# Patient Record
Sex: Male | Born: 1990
Health system: Southern US, Community
[De-identification: ages and names within clinical notes are randomized; demographics above are authoritative.]

## PROBLEM LIST (undated history)

## (undated) DIAGNOSIS — G43909 Migraine, unspecified, not intractable, without status migrainosus: Secondary | ICD-10-CM

## (undated) DIAGNOSIS — A749 Chlamydial infection, unspecified: Secondary | ICD-10-CM

## (undated) HISTORY — PX: TONSILLECTOMY: SUR1361

---

## 2015-11-21 ENCOUNTER — Encounter (HOSPITAL_COMMUNITY): Payer: Self-pay

## 2015-11-21 ENCOUNTER — Emergency Department (HOSPITAL_COMMUNITY)
Admission: EM | Admit: 2015-11-21 | Discharge: 2015-11-21 | Disposition: A | Discharge status: Home / Self Care | Payer: 59 | Attending: Emergency Medicine | Admitting: Emergency Medicine

## 2015-11-21 DIAGNOSIS — F17200 Nicotine dependence, unspecified, uncomplicated: Secondary | ICD-10-CM | POA: Diagnosis not present

## 2015-11-21 DIAGNOSIS — K226 Gastro-esophageal laceration-hemorrhage syndrome: Secondary | ICD-10-CM | POA: Diagnosis not present

## 2015-11-21 DIAGNOSIS — R112 Nausea with vomiting, unspecified: Secondary | ICD-10-CM

## 2015-11-21 DIAGNOSIS — K920 Hematemesis: Secondary | ICD-10-CM | POA: Insufficient documentation

## 2015-11-21 LAB — CBC
HCT: 49.2 % (ref 39.0–52.0)
Hemoglobin: 16.8 g/dL (ref 13.0–17.0)
MCH: 33.1 pg (ref 26.0–34.0)
MCHC: 34.1 g/dL (ref 30.0–36.0)
MCV: 96.9 fL (ref 78.0–100.0)
PLATELETS: 275 10*3/uL (ref 150–400)
RBC: 5.08 MIL/uL (ref 4.22–5.81)
RDW: 12.8 % (ref 11.5–15.5)
WBC: 6.5 10*3/uL (ref 4.0–10.5)

## 2015-11-21 LAB — POC OCCULT BLOOD, ED: Fecal Occult Bld: NEGATIVE

## 2015-11-21 LAB — ABO/RH: ABO/RH(D): A POS

## 2015-11-21 LAB — TYPE AND SCREEN
ABO/RH(D): A POS
ANTIBODY SCREEN: NEGATIVE

## 2015-11-21 LAB — COMPREHENSIVE METABOLIC PANEL
ALBUMIN: 4.5 g/dL (ref 3.5–5.0)
ALK PHOS: 47 U/L (ref 38–126)
ALT: 32 U/L (ref 17–63)
AST: 37 U/L (ref 15–41)
Anion gap: 11 (ref 5–15)
BILIRUBIN TOTAL: 0.9 mg/dL (ref 0.3–1.2)
BUN: 10 mg/dL (ref 6–20)
CALCIUM: 9.3 mg/dL (ref 8.9–10.3)
CO2: 24 mmol/L (ref 22–32)
CREATININE: 1.02 mg/dL (ref 0.61–1.24)
Chloride: 102 mmol/L (ref 101–111)
GFR calc Af Amer: >60 mL/min (ref >60)
GLUCOSE: 109 mg/dL — AB (ref 65–99)
Potassium: 4 mmol/L (ref 3.5–5.1)
Sodium: 137 mmol/L (ref 135–145)
TOTAL PROTEIN: 7.9 g/dL (ref 6.5–8.1)

## 2015-11-21 MED ORDER — OMEPRAZOLE 20 MG PO CPDR: 20.0000 mg | DELAYED_RELEASE_CAPSULE | Freq: Every day | ORAL | Status: DC

## 2015-11-21 MED ORDER — SODIUM CHLORIDE 0.9 % IV BOLUS (SEPSIS)
1000.0000 mL | Freq: Once | INTRAVENOUS | Status: AC
Start: 2015-11-21 — End: 2015-11-21
  Administered 2015-11-21: 1000 mL via INTRAVENOUS

## 2015-11-21 MED ORDER — ONDANSETRON HCL 4 MG PO TABS: 4.0000 mg | ORAL_TABLET | Freq: Four times a day (QID) | ORAL | Status: DC

## 2015-11-21 MED ORDER — ONDANSETRON 4 MG PO TBDP
4.0000 mg | ORAL_TABLET | Freq: Once | ORAL | Status: AC
  Administered 2015-11-21: 4 mg via ORAL

## 2015-11-21 MED ORDER — ONDANSETRON HCL 4 MG/2ML IJ SOLN
4.0000 mg | Freq: Once | INTRAMUSCULAR | Status: AC
  Administered 2015-11-21: 4 mg via INTRAVENOUS
  Filled 2015-11-21: qty 2

## 2015-11-21 MED ORDER — ONDANSETRON 4 MG PO TBDP
ORAL_TABLET | ORAL | Status: AC
  Filled 2015-11-21: qty 1

## 2015-11-21 NOTE — ED Notes (Signed)
Pt states vomiting blood. Pt nauseated, lightheaded and stomach pain. Pt complaining of dark tarry stools x 2 days.

## 2015-11-21 NOTE — Discharge Instructions (Signed)
Gastrointestinal Bleeding Gastrointestinal (GI) bleeding means there is bleeding somewhere along the digestive tract, between the mouth and anus. CAUSES  There are many different problems that can cause GI bleeding. Possible causes include:  Esophagitis. This is inflammation, irritation, or swelling of the esophagus.  Hemorrhoids.These are veins that are full of blood (engorged) in the rectum. They cause pain, inflammation, and may bleed.  Anal fissures.These are areas of painful tearing which may bleed. They are often caused by passing hard stool.  Diverticulosis.These are pouches that form on the colon over time, with age, and may bleed significantly.  Diverticulitis.This is inflammation in areas with diverticulosis. It can cause pain, fever, and bloody stools, although bleeding is rare.  Polyps and cancer. Colon cancer often starts out as precancerous polyps.  Gastritis and ulcers.Bleeding from the upper gastrointestinal tract (near the stomach) may travel through the intestines and produce black, sometimes tarry, often bad smelling stools. In certain cases, if the bleeding is fast enough, the stools may not be black, but red. This condition may be life-threatening. SYMPTOMS   Vomiting bright red blood or material that looks like coffee grounds.  Bloody, black, or tarry stools. DIAGNOSIS  Your caregiver may diagnose your condition by taking your history and performing a physical exam. More tests may be needed, including:  X-rays and other imaging tests.  Esophagogastroduodenoscopy (EGD). This test uses a flexible, lighted tube to look at your esophagus, stomach, and small intestine.  Colonoscopy. This test uses a flexible, lighted tube to look at your colon. TREATMENT  Treatment depends on the cause of your bleeding.   For bleeding from the esophagus, stomach, small intestine, or colon, the caregiver doing your EGD or colonoscopy may be able to stop the bleeding as part of  the procedure.  Inflammation or infection of the colon can be treated with medicines.  Many rectal problems can be treated with creams, suppositories, or warm baths.  Surgery is sometimes needed.  Blood transfusions are sometimes needed if you have lost a lot of blood. If bleeding is slow, you may be allowed to go home. If there is a lot of bleeding, you will need to stay in the hospital for observation. HOME CARE INSTRUCTIONS   Take any medicines exactly as prescribed.  Keep your stools soft by eating foods that are high in fiber. These foods include whole grains, legumes, fruits, and vegetables. Prunes (1 to 3 a day) work well for many people.  Drink enough fluids to keep your urine clear or pale yellow. SEEK IMMEDIATE MEDICAL CARE IF:   Your bleeding increases.  You feel lightheaded, weak, or you faint.  You have severe cramps in your back or abdomen.  You pass large blood clots in your stool.  Your problems are getting worse. MAKE SURE YOU:   Understand these instructions.  Will watch your condition.  Will get help right away if you are not doing well or get worse.   This information is not intended to replace advice given to you by your health care provider. Make sure you discuss any questions you have with your health care provider.   Document Released: 05/28/2000 Document Revised: 05/17/2012 Document Reviewed: 11/18/2014 Elsevier Interactive Patient Education 2016 Elsevier Inc[employer] Hematemesis Hematemesis is when you vomit blood. It is a sign of bleeding in the upper part of your digestive tract. This is also called your gastrointestinal (GI) tract. Your upper GI tract includes your mouth, throat, esophagus, stomach, and the first part of your small intestine (  duodenum).  Hematemesis is usually caused by bleeding from your esophagus or stomach. You may suddenly vomit bright red blood. You might also vomit old blood. It may look like coffee grounds. You may also have  other symptoms, such as:  Stomach pain.  Heartburn.  Black and tarry stool.  HOME CARE INSTRUCTIONS  Watch your hematemesis for any changes. The following actions may help to lessen any discomfort you are feeling:  Take medicines only as directed by your health care provider. Do not take aspirin, ibuprofen, or any other anti-inflammatory medicine without approval from your health care provider.  Rest as needed.  Drink small sips of clear liquids often, as long as you can keep them down. Try to drink enough fluids to keep your urine clear or pale yellow.  Do not drink alcohol.  Do not use any tobacco products, including cigarettes, chewing tobacco, or electronic cigarettes. If you need help quitting, ask your health care provider.  Keep all follow-up visits as directed by your health care provider. This is important. SEEK MEDICAL CARE IF:   The vomiting of blood worsens, or begins again after it has stopped.  You have persistent stomach pain.  You have nausea, indigestion, or heartburn.  You feel weak or dizzy. SEEK IMMEDIATE MEDICAL CARE IF:   You faint or feel extremely weak.  You have a rapid heartbeat.  You are urinating less than normal or not at all.  You have persistent vomiting.  You vomit large amounts of bloody or dark material.  You vomit bright red blood.  You pass large, dark, or bloody stools.  You have chest pain or trouble breathing.   This information is not intended to replace advice given to you by your health care provider. Make sure you discuss any questions you have with your health care provider.   Document Released: 07/08/2004 Document Revised: 06/21/2014 Document Reviewed: 01/23/2014 Elsevier Interactive Patient Education 2016 Elsevier Inc[employer] Nausea and Vomiting Nausea is a sick feeling that often comes before throwing up (vomiting). Vomiting is a reflex where stomach contents come out of your mouth. Vomiting can cause severe loss of body  fluids (dehydration). Children and elderly adults can become dehydrated quickly, especially if they also have diarrhea. Nausea and vomiting are symptoms of a condition or disease. It is important to find the cause of your symptoms. CAUSES   Direct irritation of the stomach lining. This irritation can result from increased acid production (gastroesophageal reflux disease), infection, food poisoning, taking certain medicines (such as nonsteroidal anti-inflammatory drugs), alcohol use, or tobacco use.  Signals from the brain.These signals could be caused by a headache, heat exposure, an inner ear disturbance, increased pressure in the brain from injury, infection, a tumor, or a concussion, pain, emotional stimulus, or metabolic problems.  An obstruction in the gastrointestinal tract (bowel obstruction).  Illnesses such as diabetes, hepatitis, gallbladder problems, appendicitis, kidney problems, cancer, sepsis, atypical symptoms of a heart attack, or eating disorders.  Medical treatments such as chemotherapy and radiation.  Receiving medicine that makes you sleep (general anesthetic) during surgery. DIAGNOSIS Your caregiver may ask for tests to be done if the problems do not improve after a few days. Tests may also be done if symptoms are severe or if the reason for the nausea and vomiting is not clear. Tests may include:  Urine tests.  Blood tests.  Stool tests.  Cultures (to look for evidence of infection).  X-rays or other imaging studies. Test results can help your caregiver make  decisions about treatment or the need for additional tests. TREATMENT You need to stay well hydrated. Drink frequently but in small amounts.You may wish to drink water, sports drinks, clear broth, or eat frozen ice pops or gelatin dessert to help stay hydrated.When you eat, eating slowly may help prevent nausea.There are also some antinausea medicines that may help prevent nausea. HOME CARE INSTRUCTIONS    Take all medicine as directed by your caregiver.  If you do not have an appetite, do not force yourself to eat. However, you must continue to drink fluids.  If you have an appetite, eat a normal diet unless your caregiver tells you differently.  Eat a variety of complex carbohydrates (rice, wheat, potatoes, bread), lean meats, yogurt, fruits, and vegetables.  Avoid high-fat foods because they are more difficult to digest.  Drink enough water and fluids to keep your urine clear or pale yellow.  If you are dehydrated, ask your caregiver for specific rehydration instructions. Signs of dehydration may include:  Severe thirst.  Dry lips and mouth.  Dizziness.  Dark urine.  Decreasing urine frequency and amount.  Confusion.  Rapid breathing or pulse. SEEK IMMEDIATE MEDICAL CARE IF:   You have blood or brown flecks (like coffee grounds) in your vomit.  You have black or bloody stools.  You have a severe headache or stiff neck.  You are confused.  You have severe abdominal pain.  You have chest pain or trouble breathing.  You do not urinate at least once every 8 hours.  You develop cold or clammy skin.  You continue to vomit for longer than 24 to 48 hours.  You have a fever. MAKE SURE YOU:   Understand these instructions.  Will watch your condition.  Will get help right away if you are not doing well or get worse.   This information is not intended to replace advice given to you by your health care provider. Make sure you discuss any questions you have with your health care provider.   Document Released: 05/31/2005 Document Revised: 08/23/2011 Document Reviewed: 10/28/2010 Elsevier Interactive Patient Education [employer]

## 2015-11-21 NOTE — ED Provider Notes (Signed)
CSN: [id number]     Arrival date & time 11/21/15  1806 History   First MD Initiated Contact with Patient 11/21/15 2132     Chief Complaint  Patient presents with  . GI Bleeding     (Consider location/radiation/quality/duration/timing/severity/associated sxs/prior Treatment) HPI Comments: Patient presents to the emergency department with chief complaint of hematemesis. He states that he was eating lunch today and having a few beers, when he began to feel nauseated. States that he vomited food, then vomited a small amount of blood, then a significant amount of blood. He had one other episode of hematemesis, and states that the blood was darker and a smaller amount. He states that he is feeling improved now. He has taken Zofran given to him in the waiting room. He denies any prior medical problems. He states that he drinks heavily. There are no modifying factors.  The history is provided by the patient. No language interpreter was used.    History reviewed. No pertinent past medical history. History reviewed. No pertinent past surgical history. History reviewed. No pertinent family history. Social History  Substance Use Topics  . Smoking status: Current Every Day Smoker  . Smokeless tobacco: None  . Alcohol Use: Yes    Review of Systems  Constitutional: Negative for fever and chills.  Respiratory: Negative for shortness of breath.   Cardiovascular: Negative for chest pain.  Gastrointestinal: Positive for vomiting. Negative for nausea, diarrhea and constipation.  Genitourinary: Negative for dysuria.  All other systems reviewed and are negative.     Allergies  Review of patient's allergies indicates no known allergies.  Home Medications   Prior to Admission medications   Not on File   BP 117/55 mmHg  Pulse 98  Temp(Src) 98.7 F (37.1 C) (Oral)  Resp 17  SpO2 100% Physical Exam  Constitutional: He is oriented to person, place, and time. He appears well-developed and  well-nourished.  HENT:  Head: Normocephalic and atraumatic.  Eyes: Conjunctivae and EOM are normal. Pupils are equal, round, and reactive to light. Right eye exhibits no discharge. Left eye exhibits no discharge. No scleral icterus.  Neck: Normal range of motion. Neck supple. No JVD present.  Cardiovascular: Normal rate, regular rhythm and normal heart sounds.  Exam reveals no gallop and no friction rub.   No murmur heard. Pulmonary/Chest: Effort normal and breath sounds normal. No respiratory distress. He has no wheezes. He has no rales. He exhibits no tenderness.  Lungs are clear to auscultation  Abdominal: Soft. He exhibits no distension and no mass. There is no tenderness. There is no rebound and no guarding.  No focal abdominal tenderness, no RLQ tenderness or pain at McBurney's point, no RUQ tenderness or Murphy's sign, no left-sided abdominal tenderness, no fluid wave, or signs of peritonitis   Musculoskeletal: Normal range of motion. He exhibits no edema or tenderness.  Neurological: He is alert and oriented to person, place, and time.  Skin: Skin is warm and dry.  Psychiatric: He has a normal mood and affect. His behavior is normal. Judgment and thought content normal.  Nursing note and vitals reviewed.   ED Course  Procedures (including critical care time) Results for orders placed or performed during the hospital encounter of 11/21/15  Comprehensive metabolic panel  Result Value Ref Range   Sodium 137 135 - 145 mmol/L   Potassium 4.0 3.5 - 5.1 mmol/L   Chloride 102 101 - 111 mmol/L   CO2 24 22 - 32 mmol/L   Glucose, Bld  109 (H) 65 - 99 mg/dL   BUN 10 6 - 20 mg/dL   Creatinine, Ser 1.611.02 0.61 - 1.24 mg/dL   Calcium 9.3 8.9 - 09.610.3 mg/dL   Total Protein 7.9 6.5 - 8.1 g/dL   Albumin 4.5 3.5 - 5.0 g/dL   AST 37 15 - 41 U/L   ALT 32 17 - 63 U/L   Alkaline Phosphatase 47 38 - 126 U/L   Total Bilirubin 0.9 0.3 - 1.2 mg/dL   GFR calc non Af Amer >60 >60 mL/min   GFR calc Af  Amer >60 >60 mL/min   Anion gap 11 5 - 15  CBC  Result Value Ref Range   WBC 6.5 4.0 - 10.5 K/uL   RBC 5.08 4.22 - 5.81 MIL/uL   Hemoglobin 16.8 13.0 - 17.0 g/dL   HCT 04.549.2 40.939.0 - 81.152.0 %   MCV 96.9 78.0 - 100.0 fL   MCH 33.1 26.0 - 34.0 pg   MCHC 34.1 30.0 - 36.0 g/dL   RDW 91.412.8 78.211.5 - 95.615.5 %   Platelets 275 150 - 400 K/uL  POC occult blood, ED  Result Value Ref Range   Fecal Occult Bld NEGATIVE NEGATIVE  Type and screen [facility]  Result Value Ref Range   ABO/RH(D) A POS    Antibody Screen NEG    Sample Expiration 11/24/2015   ABO/Rh  Result Value Ref Range   ABO/RH(D) A POS    No results found.  I have personally reviewed and evaluated these images and lab results as part of my medical decision-making.   EKG Interpretation None      MDM   Final diagnoses:  Non-intractable vomiting with nausea, vomiting of unspecified type  Mallory-Weiss tear    Patient with reported hematemesis. Vomiting started after lunch. First vomited stomach contents, then small amount of blood, followed by large blood. Had one additional episode of bloody vomit, but this has since resolved. Laboratory workup is reassuring.  Stable H&H, vital signs are stable.  10:11 PM Patient discussed with Dr. Effie ShyWentz, who states that it sounds like a mallory-weiss tear.  I agree. No focal abdominal pain.  Will give some fluids and PO challenge.  Patient still has not had any vomiting. Is feeling well. Requests discharge. Patient given return precautions. He understands and agrees with plan.  Roxy Horsemanobert Odessie Polzin, PA-C 11/22/15 0006  Mancel BaleElliott Wentz, MD 11/22/15 [phone]

## 2015-12-27 ENCOUNTER — Encounter (HOSPITAL_COMMUNITY): Payer: Self-pay | Admitting: Emergency Medicine

## 2015-12-27 ENCOUNTER — Ambulatory Visit (HOSPITAL_COMMUNITY)
Admission: EM | Admit: 2015-12-27 | Discharge: 2015-12-27 | Disposition: A | Discharge status: Home / Self Care | Payer: 59 | Attending: Emergency Medicine | Admitting: Emergency Medicine

## 2015-12-27 DIAGNOSIS — R109 Unspecified abdominal pain: Secondary | ICD-10-CM | POA: Insufficient documentation

## 2015-12-27 DIAGNOSIS — R509 Fever, unspecified: Secondary | ICD-10-CM | POA: Diagnosis not present

## 2015-12-27 DIAGNOSIS — F17200 Nicotine dependence, unspecified, uncomplicated: Secondary | ICD-10-CM | POA: Insufficient documentation

## 2015-12-27 DIAGNOSIS — Z79899 Other long term (current) drug therapy: Secondary | ICD-10-CM | POA: Insufficient documentation

## 2015-12-27 DIAGNOSIS — R369 Urethral discharge, unspecified: Secondary | ICD-10-CM | POA: Diagnosis not present

## 2015-12-27 HISTORY — DX: Migraine, unspecified, not intractable, without status migrainosus: G43.909

## 2015-12-27 LAB — POCT URINALYSIS DIP (DEVICE)
Bilirubin Urine: NEGATIVE
GLUCOSE, UA: NEGATIVE mg/dL
Hgb urine dipstick: NEGATIVE
KETONES UR: NEGATIVE mg/dL
Nitrite: NEGATIVE
PH: 6 (ref 5.0–8.0)
PROTEIN: NEGATIVE mg/dL
UROBILINOGEN UA: 0.2 mg/dL (ref 0.0–1.0)

## 2015-12-27 LAB — COMPREHENSIVE METABOLIC PANEL
ALK PHOS: 64 U/L (ref 38–126)
ALT: 36 U/L (ref 17–63)
ANION GAP: 9 (ref 5–15)
AST: 39 U/L (ref 15–41)
Albumin: 3.9 g/dL (ref 3.5–5.0)
BILIRUBIN TOTAL: 0.5 mg/dL (ref 0.3–1.2)
BUN: 7 mg/dL (ref 6–20)
CALCIUM: 9.4 mg/dL (ref 8.9–10.3)
CO2: 25 mmol/L (ref 22–32)
Chloride: 101 mmol/L (ref 101–111)
Creatinine, Ser: 0.89 mg/dL (ref 0.61–1.24)
Glucose, Bld: 124 mg/dL — ABNORMAL HIGH (ref 65–99)
POTASSIUM: 3.4 mmol/L — AB (ref 3.5–5.1)
Sodium: 135 mmol/L (ref 135–145)
TOTAL PROTEIN: 7.5 g/dL (ref 6.5–8.1)

## 2015-12-27 MED ORDER — LIDOCAINE HCL (PF) 1 % IJ SOLN
INTRAMUSCULAR | Status: AC
  Filled 2015-12-27: qty 2

## 2015-12-27 MED ORDER — CEFTRIAXONE SODIUM 250 MG IJ SOLR
250.0000 mg | Freq: Once | INTRAMUSCULAR | Status: AC
  Administered 2015-12-27: 250 mg via INTRAMUSCULAR

## 2015-12-27 MED ORDER — AZITHROMYCIN 250 MG PO TABS
ORAL_TABLET | ORAL | Status: AC
Start: 2015-12-27 — End: 2015-12-27
  Filled 2015-12-27: qty 4

## 2015-12-27 MED ORDER — IBUPROFEN 800 MG PO TABS
ORAL_TABLET | ORAL | Status: AC
  Filled 2015-12-27: qty 1

## 2015-12-27 MED ORDER — CIPROFLOXACIN HCL 500 MG PO TABS: 500.0000 mg | ORAL_TABLET | Freq: Two times a day (BID) | ORAL | Status: DC

## 2015-12-27 MED ORDER — AZITHROMYCIN 250 MG PO TABS
1000.0000 mg | ORAL_TABLET | Freq: Once | ORAL | Status: AC
  Administered 2015-12-27: 1000 mg via ORAL

## 2015-12-27 MED ORDER — CEFTRIAXONE SODIUM 250 MG IJ SOLR
INTRAMUSCULAR | Status: AC
  Filled 2015-12-27: qty 250

## 2015-12-27 MED ORDER — IBUPROFEN 800 MG PO TABS
400.0000 mg | ORAL_TABLET | Freq: Once | ORAL | Status: AC
  Administered 2015-12-27: 400 mg via ORAL

## 2015-12-27 NOTE — ED Notes (Addendum)
Feeling really tired , thirsty, increase in frequency.  Pain in lower back.  Symptoms started this morning.  Reports intermittent shooting pain in testicular

## 2015-12-27 NOTE — Discharge Instructions (Signed)
It was nice seeing you today. I am sorry about your symptoms. It could be that you have urine infection. We will send your urine to lab for culture. While awaiting report please start oral antibiotic. We also treated you for gonorrhea and chlamydia after obtaining your urine for testing. We will contact you with result if abnormal. We did HIV and syphilis testing as well. If symptoms worsens please go to the ED.

## 2015-12-27 NOTE — ED Provider Notes (Signed)
CSN: [id number]     Arrival date & time 12/27/15  1851 History   None    No chief complaint on file.  (Consider location/radiation/quality/duration/timing/severity/associated sxs/prior Treatment) Patient is a 25 y.o. male presenting with back pain and penile discharge. The history is provided by the patient. No language interpreter was used.  Back Pain Location:  Lumbar spine (flank pain B/L) Quality:  Aching Radiates to:  Does not radiate Pain severity:  Moderate (pain is about 10/10 in severity) Onset quality:  Sudden Duration:  2 days Timing:  Constant Progression:  Worsening Chronicity:  New Context comment:  NO INJURY Relieved by:  Nothing Worsened by:  Nothing tried Ineffective treatments:  None tried Associated symptoms: fever   Associated symptoms: no abdominal pain, no abdominal swelling, no bladder incontinence, no bowel incontinence, no chest pain, no dysuria, no numbness, no paresthesias and no weight loss   Associated symptoms comment:  Brief scrotal pain for short period few hours ago, now it is all better. Risk factors comment:  Recently had sex with his girl friend and she told him she was positive for chlamydia. Endorsed penile discharge. Penile Discharge This is a new problem. The current episode started 2 days ago. The problem occurs constantly. The problem has not changed since onset.Pertinent negatives include no chest pain and no abdominal pain. Associated symptoms comments: Recently had unprotected sex with girl friend who had chlamydia. Nothing aggravates the symptoms. Nothing relieves the symptoms. He has tried nothing for the symptoms.    No past medical history on file. No past surgical history on file. No family history on file. Social History  Substance Use Topics  . Smoking status: Current Every Day Smoker  . Smokeless tobacco: Not on file  . Alcohol Use: Yes    Review of Systems  Constitutional: Positive for fever. Negative for weight loss.   Respiratory: Negative.   Cardiovascular: Negative.  Negative for chest pain.  Gastrointestinal: Negative for abdominal pain and bowel incontinence.  Genitourinary: Positive for discharge. Negative for bladder incontinence and dysuria.  Musculoskeletal: Positive for back pain.  Neurological: Negative for numbness and paresthesias.  All other systems reviewed and are negative.   Allergies  Review of patient's allergies indicates no known allergies.  Home Medications   Prior to Admission medications   Medication Sig Start Date End Date Taking? Authorizing Provider  omeprazole (PRILOSEC) 20 MG capsule Take 1 capsule (20 mg total) by mouth daily. 11/21/15   Roxy Horseman, PA-C  ondansetron (ZOFRAN) 4 MG tablet Take 1 tablet (4 mg total) by mouth every 6 (six) hours. 11/21/15   Roxy Horseman, PA-C   Meds Ordered and Administered this Visit  Medications - No data to display  There were no vitals taken for this visit. No data found.   Physical Exam  Constitutional: He is oriented to person, place, and time. He appears well-developed. No distress.  Cardiovascular: Normal rate, regular rhythm and normal heart sounds.   No murmur heard. Pulmonary/Chest: Effort normal and breath sounds normal. No respiratory distress. He has no wheezes.  Abdominal: Soft. Bowel sounds are normal. He exhibits no distension and no mass. There is no tenderness.  Musculoskeletal: Normal range of motion. He exhibits no edema.  Neurological: He is alert and oriented to person, place, and time.  Nursing note and vitals reviewed.   ED Course  Procedures (including critical care time)  Labs Review Labs Reviewed - No data to display  Imaging Review No results found.   Visual Acuity  Review  Right Eye Distance:   Left Eye Distance:   Bilateral Distance:    Right Eye Near:   Left Eye Near:    Bilateral Near:      Urinalysis    Component Value Date/Time   LABSPEC <=1.005 12/27/2015 2055   PHURINE  6.0 12/27/2015 2055   GLUCOSEU NEGATIVE 12/27/2015 2055   HGBUR NEGATIVE 12/27/2015 2055   BILIRUBINUR NEGATIVE 12/27/2015 2055   KETONESUR NEGATIVE 12/27/2015 2055   PROTEINUR NEGATIVE 12/27/2015 2055   UROBILINOGEN 0.2 12/27/2015 2055   NITRITE NEGATIVE 12/27/2015 2055   LEUKOCYTESUR TRACE* 12/27/2015 2055        MDM  No diagnosis found. Fever, unspecified fever cause  Flank pain  Penile discharge   Ibuprofen given for fever here. UA report as above, not so impressive but since he is symptomatic, will obtain culture and give antibiotic. Hx of exposure to STD. Ceftriaxone and Zithromax given. STD screening done. Will contact with result if abnormal.  Mom requested Bmet check which was done.  Return precaution discussed. If no improvement or worsening please go to the ED.    Doreene ElandKehinde T Lejon Afzal, MD 12/27/15 2117

## 2015-12-28 LAB — HIV ANTIBODY (ROUTINE TESTING W REFLEX): HIV SCREEN 4TH GENERATION: NONREACTIVE

## 2015-12-29 ENCOUNTER — Telehealth: Payer: Self-pay | Admitting: Family Medicine

## 2015-12-29 LAB — URINE CYTOLOGY ANCILLARY ONLY
CHLAMYDIA, DNA PROBE: POSITIVE — AB
Neisseria Gonorrhea: NEGATIVE

## 2015-12-29 LAB — URINE CULTURE: CULTURE: NO GROWTH

## 2015-12-29 LAB — RPR: RPR Ser Ql: NONREACTIVE

## 2015-12-29 NOTE — Telephone Encounter (Signed)
Patient returned my call. I discussed his results with him. Urine culture negative so he can safely d/c the antibiotic. Serum glucose is slightly elevated. I recommended f/u with PCP for DM screening if having polydipsia,polyuria,weight loss of family hx of DM. GC/Chlamydia result still pending although he already got treated for it.  Patient stated he feels much well today and understands all instructions given.

## 2015-12-29 NOTE — Telephone Encounter (Signed)
Message left to call back for test results.

## 2015-12-30 NOTE — Telephone Encounter (Signed)
Patient called on 12/30/15 to discuss positive chlamydia. No response hence HIPPA compliant message left to call back.  He already got treated during his visit. Plan to get test of cure in 1-2 weeks with his PCP.

## 2015-12-30 NOTE — Telephone Encounter (Signed)
Patient called back. Result discussed with him. Recommended test of cure in 2 wks. Her verbalized understanding.

## 2017-01-30 ENCOUNTER — Encounter (HOSPITAL_COMMUNITY): Payer: Self-pay | Admitting: *Deleted

## 2017-01-30 ENCOUNTER — Ambulatory Visit (HOSPITAL_COMMUNITY)
Admission: EM | Admit: 2017-01-30 | Discharge: 2017-01-30 | Disposition: A | Discharge status: Home / Self Care | Payer: 59 | Attending: Family Medicine | Admitting: Family Medicine

## 2017-01-30 DIAGNOSIS — R369 Urethral discharge, unspecified: Secondary | ICD-10-CM | POA: Insufficient documentation

## 2017-01-30 DIAGNOSIS — R300 Dysuria: Secondary | ICD-10-CM | POA: Insufficient documentation

## 2017-01-30 HISTORY — DX: Chlamydial infection, unspecified: A74.9

## 2017-01-30 MED ORDER — CEFTRIAXONE SODIUM 250 MG IJ SOLR
INTRAMUSCULAR | Status: AC
Start: 2017-01-30 — End: 2017-01-30
  Filled 2017-01-30: qty 250

## 2017-01-30 MED ORDER — AZITHROMYCIN 250 MG PO TABS
1000.0000 mg | ORAL_TABLET | Freq: Once | ORAL | Status: AC
  Administered 2017-01-30: 1000 mg via ORAL

## 2017-01-30 MED ORDER — CEFTRIAXONE SODIUM 250 MG IJ SOLR
250.0000 mg | Freq: Once | INTRAMUSCULAR | Status: AC
  Administered 2017-01-30: 250 mg via INTRAMUSCULAR

## 2017-01-30 MED ORDER — AZITHROMYCIN 250 MG PO TABS
ORAL_TABLET | ORAL | Status: AC
  Filled 2017-01-30: qty 4

## 2017-01-30 MED ORDER — LIDOCAINE HCL (PF) 1 % IJ SOLN
INTRAMUSCULAR | Status: AC
  Filled 2017-01-30: qty 2

## 2017-01-30 NOTE — ED Provider Notes (Signed)
  [facility]   [id number] 01/30/17 Arrival Time: 1221   SUBJECTIVE:  Dustin Reynolds is a 26 y.o. male who presents to the urgent care  with complaint of penile discharge and dysuria that is been ongoing for 2 days. He states he has had chlamydia in the past, and his symptoms are consistent with the past diagnosis. He is sexually active, with intermittent condom use. No fever, chills, nausea, vomiting, diarrhea, or other symptoms.  ROS: As per HPI, remainder of ROS negative.   OBJECTIVE:  Vitals:   01/30/17 1317  BP: (!) 149/86  Pulse: (!) 104  Resp: 18  Temp: 98.8 F (37.1 C)  TempSrc: Oral  SpO2: 100%     General appearance: alert; no distress HEENT: normocephalic; atraumatic; conjunctivae normal;  Neck: No cervical lymphadenopathy Lungs: clear to auscultation bilaterally Heart: regular rate and rhythm Abdomen: soft, non-tender; bowel sounds normal; no masses or organomegaly; no guarding or rebound tenderness GU: Deferred, urine cytology will be obtained Musculoskeletal/extremities: Pulses +2, no dependent edema, grossly symmetrical Skin: warm and dry Neurologic: Grossly normal Psychological:  alert and cooperative; normal mood and affect     ASSESSMENT & PLAN:  1. Penile discharge     Meds ordered this encounter  Medications  . azithromycin (ZITHROMAX) tablet 1,000 mg  . cefTRIAXone (ROCEPHIN) injection 250 mg    Reviewed expectations re: course of current medical issues. Questions answered. Outlined signs and symptoms indicating need for more acute intervention. Patient verbalized understanding. After Visit Summary given.    Procedures:      Labs Reviewed  URINE CYTOLOGY ANCILLARY ONLY    No results found.  No Known Allergies  PMHx, SurgHx, SocialHx, Medications, and Allergies were reviewed in the Visit Navigator and updated as appropriate.       Dorena Bodo, NP 01/30/17 [phone]

## 2017-01-30 NOTE — Discharge Instructions (Signed)
You have been treated today with Rocephin, and azithromycin, we are testing you urine for gonorrhea, chlamydia, and trichomonas. If there are any positives, we will contact you in 3-5 business days. Practice safe sex, and wear a condom for the next 7 days, as it will take this long for the medicine to work. If symptoms persist, follow-up with the health department, or return to clinic as needed

## 2017-01-30 NOTE — ED Triage Notes (Signed)
C/O penile discharge and dysuria x 2 days.  States has had in past with chlamydia.

## 2017-01-31 LAB — URINE CYTOLOGY ANCILLARY ONLY
CHLAMYDIA, DNA PROBE: POSITIVE — AB
Neisseria Gonorrhea: NEGATIVE
Trichomonas: NEGATIVE

## 2017-02-14 ENCOUNTER — Telehealth (HOSPITAL_COMMUNITY): Payer: Self-pay | Admitting: Emergency Medicine

## 2017-02-14 ENCOUNTER — Ambulatory Visit (HOSPITAL_COMMUNITY)
Admission: EM | Admit: 2017-02-14 | Discharge: 2017-02-14 | Disposition: A | Discharge status: Home / Self Care | Payer: 59 | Attending: Internal Medicine | Admitting: Internal Medicine

## 2017-02-14 ENCOUNTER — Ambulatory Visit (INDEPENDENT_AMBULATORY_CARE_PROVIDER_SITE_OTHER): Payer: 59

## 2017-02-14 ENCOUNTER — Encounter (HOSPITAL_COMMUNITY): Payer: Self-pay | Admitting: Emergency Medicine

## 2017-02-14 DIAGNOSIS — S62339A Displaced fracture of neck of unspecified metacarpal bone, initial encounter for closed fracture: Secondary | ICD-10-CM | POA: Diagnosis not present

## 2017-02-14 DIAGNOSIS — S62616A Displaced fracture of proximal phalanx of right little finger, initial encounter for closed fracture: Secondary | ICD-10-CM | POA: Diagnosis not present

## 2017-02-14 DIAGNOSIS — W2209XA Striking against other stationary object, initial encounter: Secondary | ICD-10-CM

## 2017-02-14 DIAGNOSIS — M79641 Pain in right hand: Secondary | ICD-10-CM | POA: Diagnosis not present

## 2017-02-14 MED ORDER — MELOXICAM 7.5 MG PO TABS: 7.5000 mg | ORAL_TABLET | Freq: Every day | ORAL | 0 refills | Status: DC

## 2017-02-14 MED ORDER — KETOROLAC TROMETHAMINE 30 MG/ML IJ SOLN
30.0000 mg | Freq: Once | INTRAMUSCULAR | Status: AC
  Administered 2017-02-14: 30 mg via INTRAMUSCULAR

## 2017-02-14 MED ORDER — KETOROLAC TROMETHAMINE 30 MG/ML IJ SOLN
INTRAMUSCULAR | Status: AC
  Filled 2017-02-14: qty 1

## 2017-02-14 NOTE — ED Notes (Signed)
Ortho Tech returned page. 

## 2017-02-14 NOTE — Discharge Instructions (Signed)
Your giver and a Toradol injection in office. Take Mobic as directed for pain. Follow-up with orthopedics for further evaluation and treatment. If experiencing worsening pain, swelling of the finger, changes in finger color, numbness tingling of the fingers, follow up at the emergency department for further evaluation.

## 2017-02-14 NOTE — ED Notes (Signed)
[employer] paged.

## 2017-02-14 NOTE — ED Provider Notes (Signed)
[facility]    CSN: [id number] Arrival date & time: 02/14/17  1007     History   Chief Complaint Chief Complaint  Patient presents with  . Hand Pain    HPI Dustin Reynolds is a 26 y.o. male.   26 year old male comes in for 2 week history of right hand pain. He states he punched "an inanimate object" that caused the pain. Pain is mostly focused on on ulnar aspect of the hand. With associated swelling. He iced it for the first day, then took ibuprofen intermittently throughout the past 2 weeks. He denies any numbness, tingling. He is not able to move his fifth finger with this injury.       Past Medical History:  Diagnosis Date  . Chlamydia   . Migraine     There are no active problems to display for this patient.   Past Surgical History:  Procedure Laterality Date  . TONSILLECTOMY         Home Medications    Prior to Admission medications   Medication Sig Start Date End Date Taking? Authorizing Provider  meloxicam (MOBIC) 7.5 MG tablet Take 1 tablet (7.5 mg total) by mouth daily. 02/14/17   Belinda Fisher, PA-C    Family History No family history on file.  Social History Social History  Substance Use Topics  . Smoking status: Current Every Day Smoker    Types: Cigarettes  . Smokeless tobacco: Not on file  . Alcohol use Yes     Comment: daily - mixed drinks or beer     Allergies   Patient has no known allergies.   Review of Systems Review of Systems  Reason unable to perform ROS: See HPI as above.     Physical Exam Triage Vital Signs ED Triage Vitals  Enc Vitals Group     BP 02/14/17 1100 (!) 148/93     Pulse Rate 02/14/17 1100 93     Resp 02/14/17 1100 18     Temp 02/14/17 1100 98.1 F (36.7 C)     Temp Source 02/14/17 1100 Oral     SpO2 02/14/17 1100 100 %     Weight --      Height --      Head Circumference --      Peak Flow --      Pain Score 02/14/17 1058 7     Pain Loc --      Pain Edu? --      Excl. in GC? --    No data  found.   Updated Vital Signs BP (!) 148/93 (BP Location: Left Arm)   Pulse 93   Temp 98.1 F (36.7 C) (Oral)   Resp 18   SpO2 100%    Physical Exam  Constitutional: He is oriented to person, place, and time. He appears well-developed and well-nourished. No distress.  HENT:  Head: Normocephalic and atraumatic.  Eyes: Pupils are equal, round, and reactive to light. Conjunctivae are normal.  Musculoskeletal:  No wounds, abrasions seen on finger/hand. No tenderness on palpation of the wrists. Tenderness on palpation of the fifth metacarpal, max tenderness on palpation at the distal fifth metacarpal, with associated swelling. Decreased flexion of fifth finger. Sensation intact and equal bilaterally. Radial pulses 2+ and equal bilaterally. Cap refill less than 2 seconds.  Neurological: He is alert and oriented to person, place, and time.     UC Treatments / Results  Labs (all labs ordered are listed, but only abnormal results  are displayed) Labs Reviewed - No data to display  EKG  EKG Interpretation None       Radiology Dg Hand Complete Right  Result Date: 02/14/2017 CLINICAL DATA:  Punching injury, pain and deformity laterally EXAM: RIGHT HAND - COMPLETE 3+ VIEW COMPARISON:  None available FINDINGS: There is an acute minimally displaced and angulated fracture of the right fifth metacarpal distally compatible with a boxer's fracture. Mild soft tissue swelling. No other acute osseous finding or joint abnormality. No associated subluxation or dislocation. IMPRESSION: Acute minimally displaced and angulated fracture of the right fifth metacarpal (boxer's fracture) Electronically Signed   By: Judie PetitM.  Shick M.D.   On: 02/14/2017 11:24    Procedures Procedures (including critical care time)  Medications Ordered in UC Medications  ketorolac (TORADOL) 30 MG/ML injection 30 mg (30 mg Intramuscular Given 02/14/17 1229)     Initial Impression / Assessment and Plan / UC Course  I have  reviewed the triage vital signs and the nursing notes.  Pertinent labs & imaging results that were available during my care of the patient were reviewed by me and considered in my medical decision making (see chart for details).  Clinical Course as of Feb 14 2058  [facility]  1215 DG Hand Complete Right [AY]    Clinical Course User Index [AY] Belinda FisherYu, Elka Satterfield V, PA-C   Discussed x-ray results with patient. Patient placed in ulnar gutter splint. Toradol injection in office for pain. Mobic as directed for pain. Patient declined narcotics. Patient to follow up with hand specialist for further evaluation and treatment. Resources given. Return precautions given.   Final Clinical Impressions(s) / UC Diagnoses   Final diagnoses:  Closed boxer's fracture, initial encounter    New Prescriptions Discharge Medication List as of 02/14/2017 12:15 PM    START taking these medications   Details  meloxicam (MOBIC) 7.5 MG tablet Take 1 tablet (7.5 mg total) by mouth daily., Starting Mon 02/14/2017, Normal           Linward HeadlandYu, Kahle Mcqueen V, PA-C 02/14/17 2059

## 2017-02-14 NOTE — ED Notes (Signed)
Ortho Tech paged x2. 

## 2017-02-14 NOTE — ED Triage Notes (Signed)
Reports punching an " inanimate" object 2 weeks ago.  Continued swelling and pain.  Limited range of movement in straightening out hand and making a fist.

## 2017-02-14 NOTE — Telephone Encounter (Signed)
Pt's [facility] called [facility] [address]  Sts [facility] but they are closed due to [facility] Day  Spoke w/pt to get verbal ok to speak w/mother  [facility]

## 2017-02-14 NOTE — ED Notes (Signed)
Ortho Tech arrived for Ulnar/Guttar splint of right arm.

## 2017-02-14 NOTE — Progress Notes (Signed)
Orthopedic Tech Progress Note Patient Details:  Dustin Reynolds November 23, 1990 [id number]  Ortho Devices Type of Ortho Device: Ulna gutter splint Ortho Device/Splint Location: applied ulna gutter splint to pt right hand.  pt tolerated application very well.  provided arm sling to pt right hand/arm for support.  right hand. Ortho Device/Splint Interventions: Application, Adjustment   Alvina ChouWilliams, Gwynevere Lizana C 02/14/2017, 2:54 PM

## 2018-02-28 IMAGING — DX DG HAND COMPLETE 3+V*R*
3 series · 3 of 3 positions shown · non-contrast
Comparison: None available

CLINICAL DATA: Punching injury, pain and deformity laterally

EXAM:
RIGHT HAND - COMPLETE 3+ VIEW

[hand pa]
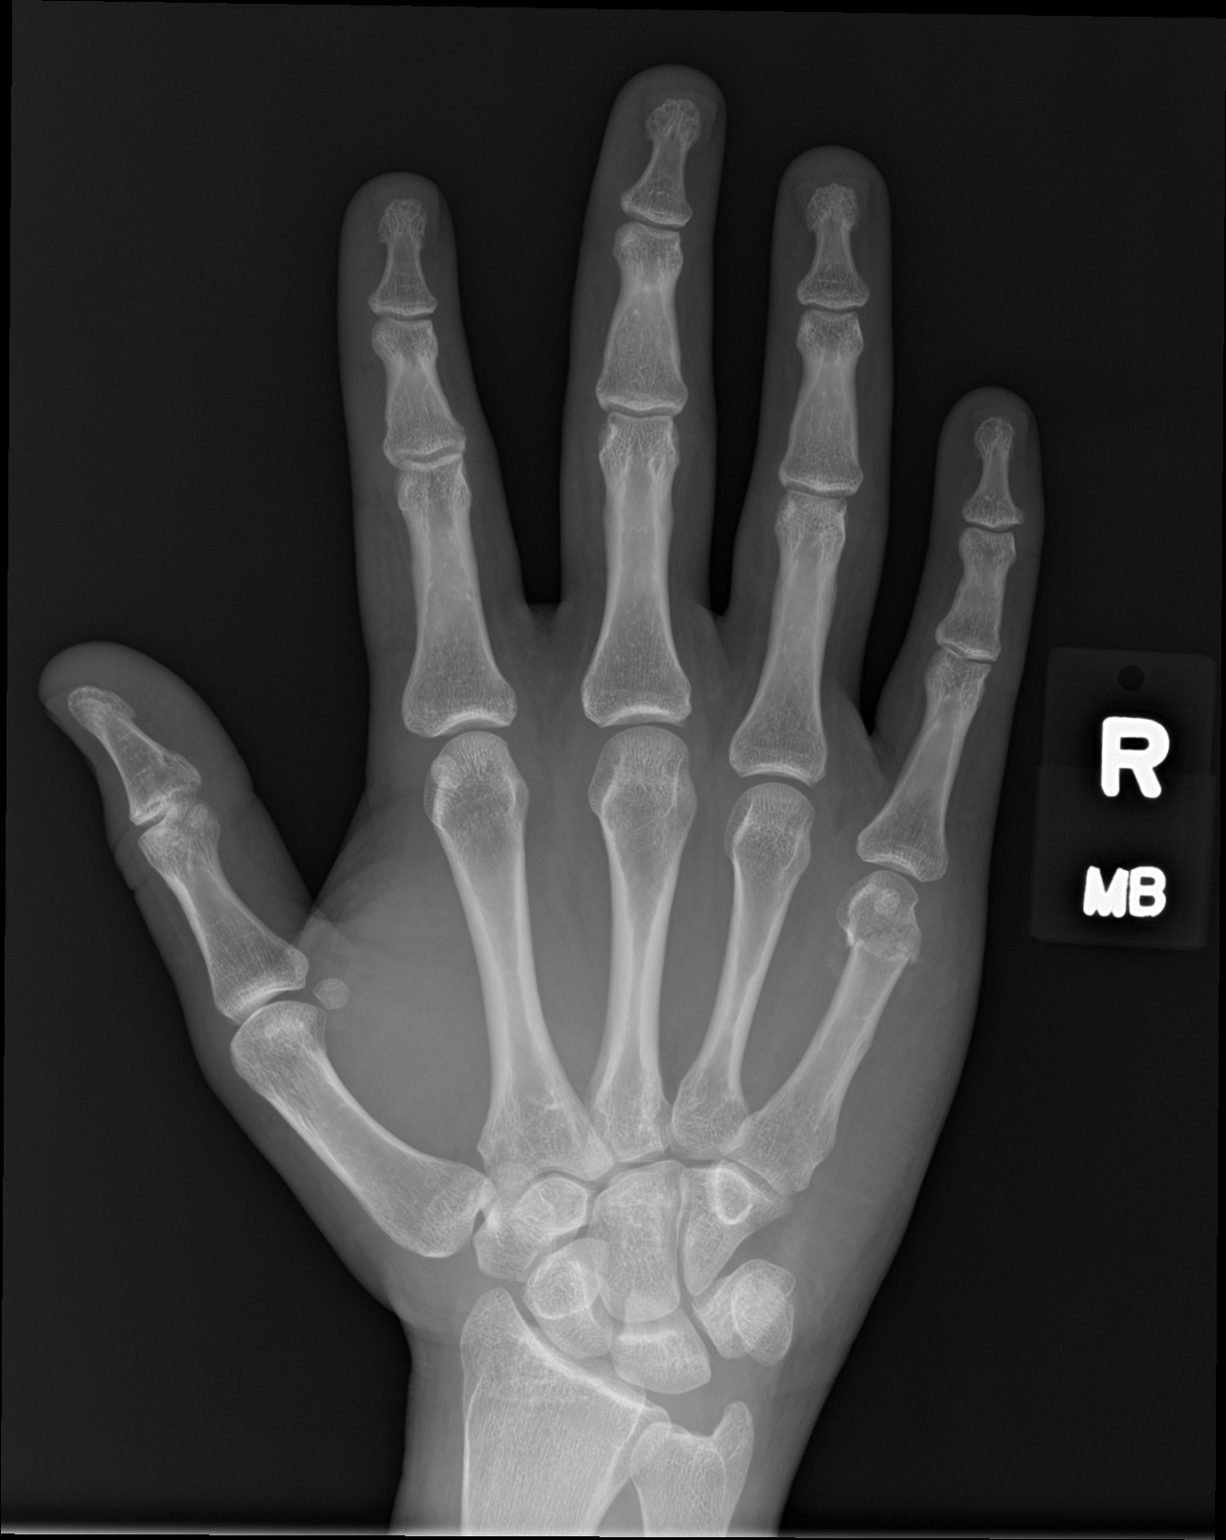

[hand obl]
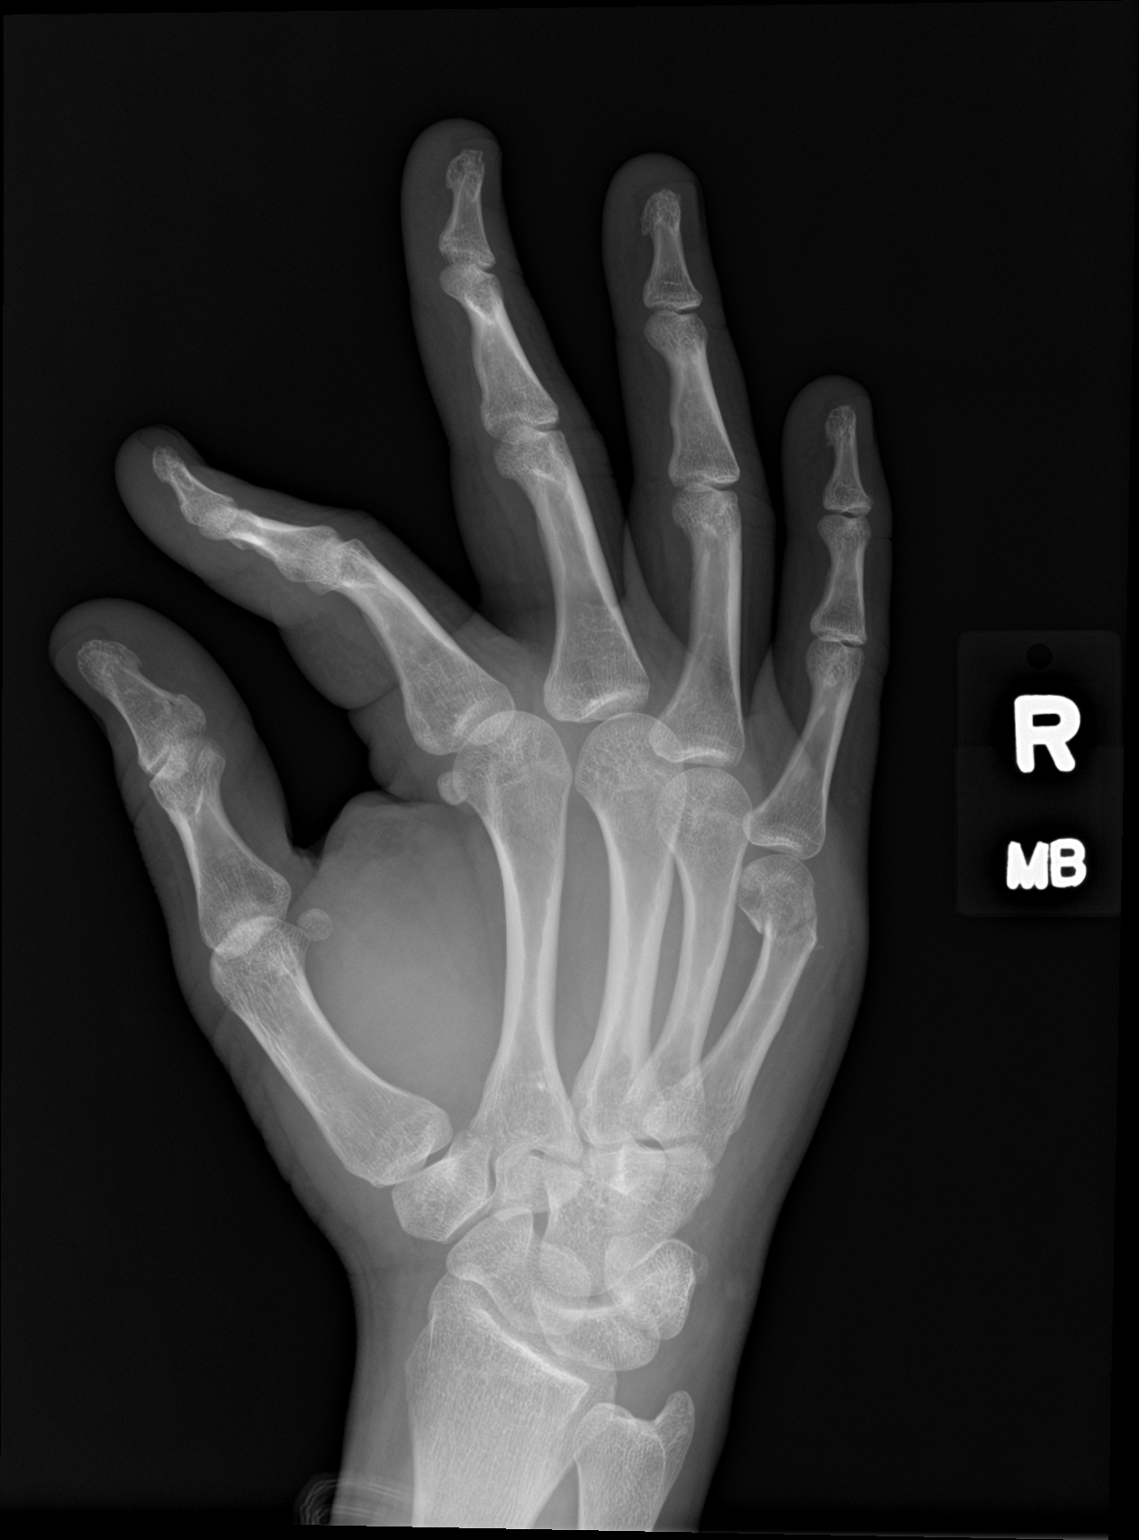

[hand lat]
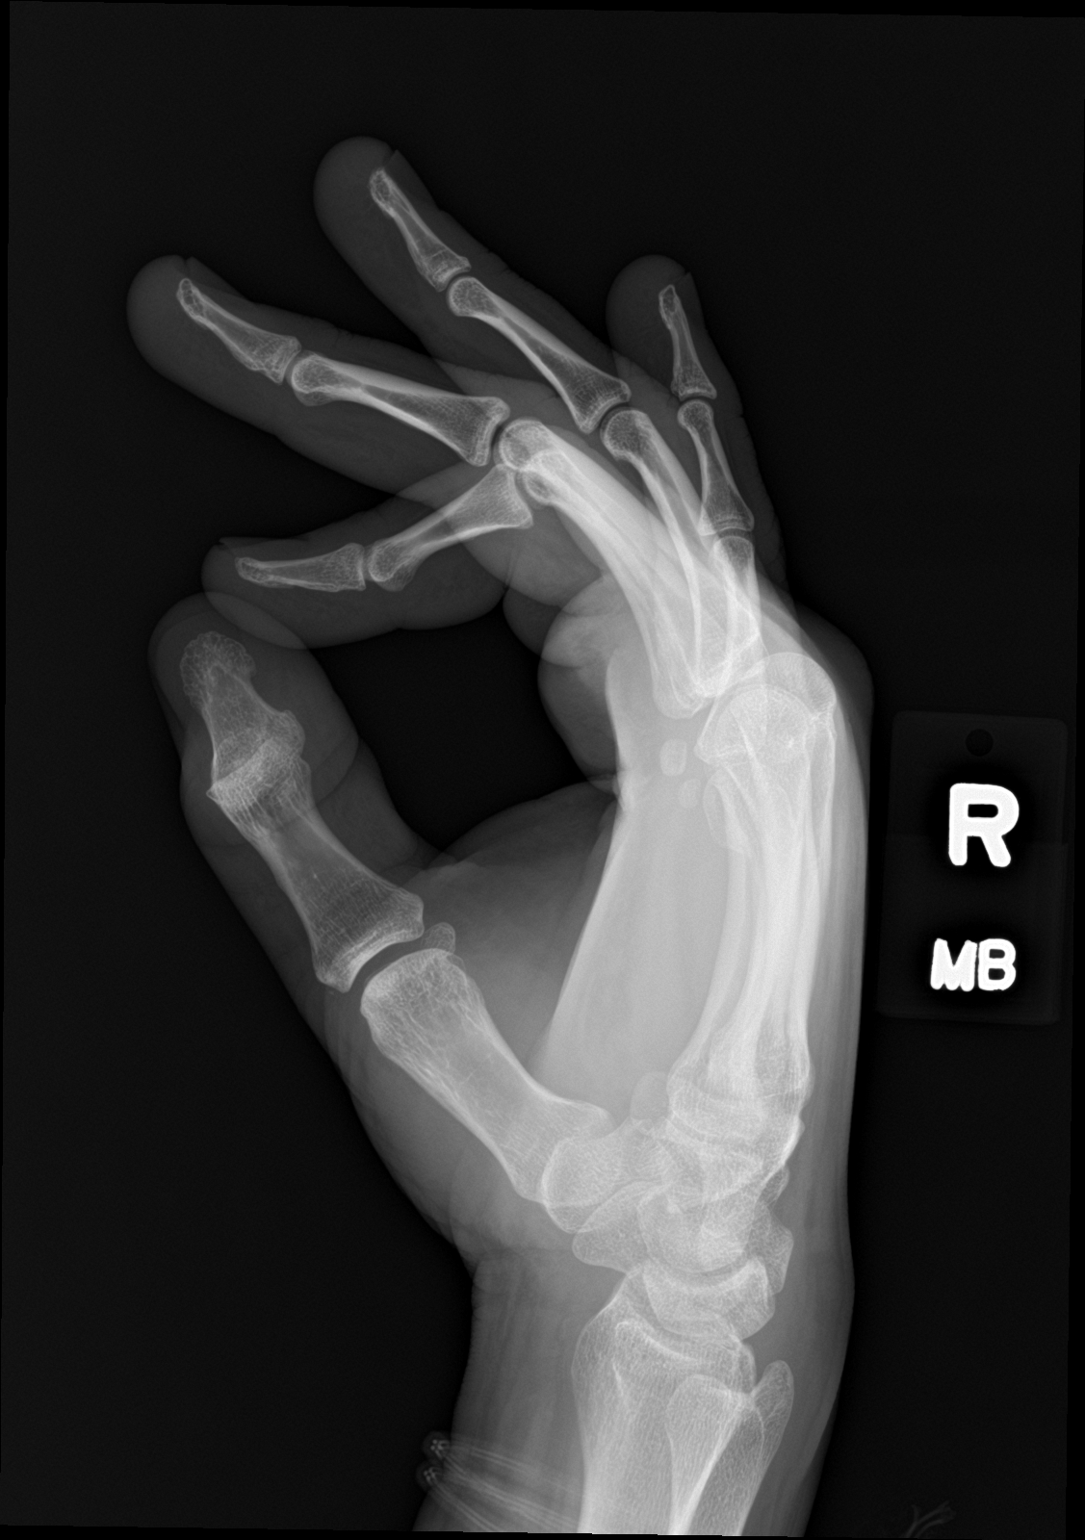

[3 of 3 positions shown; findings below may reference images not displayed]

FINDINGS: There is an acute minimally displaced and angulated fracture of the
right fifth metacarpal distally compatible with a boxer's fracture.
Mild soft tissue swelling. No other acute osseous finding or joint
abnormality. No associated subluxation or dislocation.
IMPRESSION: Acute minimally displaced and angulated fracture of the right fifth
metacarpal (boxer's fracture)

## 2019-12-26 ENCOUNTER — Emergency Department (HOSPITAL_BASED_OUTPATIENT_CLINIC_OR_DEPARTMENT_OTHER)
Admission: EM | Admit: 2019-12-26 | Discharge: 2019-12-26 | Disposition: A | Discharge status: Home / Self Care | Payer: 59 | Attending: Emergency Medicine | Admitting: Emergency Medicine

## 2019-12-26 ENCOUNTER — Encounter (HOSPITAL_BASED_OUTPATIENT_CLINIC_OR_DEPARTMENT_OTHER): Payer: Self-pay | Admitting: *Deleted

## 2019-12-26 ENCOUNTER — Other Ambulatory Visit: Payer: Self-pay

## 2019-12-26 DIAGNOSIS — Z87891 Personal history of nicotine dependence: Secondary | ICD-10-CM | POA: Insufficient documentation

## 2019-12-26 DIAGNOSIS — M542 Cervicalgia: Secondary | ICD-10-CM | POA: Insufficient documentation

## 2019-12-26 MED ORDER — KETOROLAC TROMETHAMINE 30 MG/ML IJ SOLN
15.0000 mg | Freq: Once | INTRAMUSCULAR | Status: AC
  Administered 2019-12-26: 15 mg via INTRAMUSCULAR
  Filled 2019-12-26: qty 1

## 2019-12-26 MED ORDER — NAPROXEN 500 MG PO TABS: 500.0000 mg | ORAL_TABLET | Freq: Two times a day (BID) | ORAL | 0 refills | Status: AC

## 2019-12-26 MED ORDER — METHOCARBAMOL 500 MG PO TABS: 500.0000 mg | ORAL_TABLET | Freq: Two times a day (BID) | ORAL | 0 refills | Status: AC | PRN

## 2019-12-26 NOTE — ED Provider Notes (Signed)
[facility] EMERGENCY DEPARTMENT Provider Note   CSN: [id number] Arrival date & time: 12/26/19  1500     History Chief Complaint  Patient presents with  . Sore Throat    Dustin Reynolds is a 29 y.o. male presenting for evaluation of left-sided neck pain.  Patient states that the past 2 to 3 days, he has had left-sided neck pain.  He reports soreness and pain with movement of his head, as well as when he swallows.  Pain is nonradiating to his temple and shoulder.  It is constant, nothing makes it better.  He has not taken anything for it.  He denies fevers, chills, nasal congestion, ear pain, cough.  He denies change in his voice or difficulty opening his mouth.  States pain does not feel like it is on the inside of his throat, more on the outside.  HPI     Past Medical History:  Diagnosis Date  . Chlamydia   . Migraine     There are no problems to display for this patient.   Past Surgical History:  Procedure Laterality Date  . TONSILLECTOMY         History reviewed. No pertinent family history.  Social History   Tobacco Use  . Smoking status: Former Smoker    Types: Cigarettes  . Smokeless tobacco: Never Used  Vaping Use  . Vaping Use: Every day  Substance Use Topics  . Alcohol use: Yes    Comment: daily - mixed drinks or beer  . Drug use: No    Home Medications Prior to Admission medications   Medication Sig Start Date End Date Taking? Authorizing Provider  methocarbamol (ROBAXIN) 500 MG tablet Take 1 tablet (500 mg total) by mouth 2 (two) times daily as needed for muscle spasms. 12/26/19   Haedyn Ancrum, PA-C  naproxen (NAPROSYN) 500 MG tablet Take 1 tablet (500 mg total) by mouth 2 (two) times daily with a meal. 12/26/19   Janesha Brissette, PA-C    Allergies    Patient has no known allergies.  Review of Systems   Review of Systems  Constitutional: Negative for fever.  Musculoskeletal: Positive for neck pain.    Physical Exam Updated  Vital Signs BP (!) 130/99   Pulse 91   Temp 98.1 F (36.7 C)   Resp 18   Ht 5\' 6"  (1.676 m)   Wt 72.6 kg   SpO2 100%   BMI 25.82 kg/m   Physical Exam Vitals and nursing note reviewed.  Constitutional:      General: He is not in acute distress.    Appearance: He is well-developed.     Comments: Sitting in the bed in no acute distress  HENT:     Head: Normocephalic and atraumatic.     Comments: OP clear without tonsillar swelling exudate.  Uvula midline with equal palate rise.  TMs nonerythematous nonbulging bilaterally.  No trismus.  Handling secretions easily.  No muffled voice. Neck:     Comments: Tenderness palpation of the left side neck.  ?Mild cervical lymphadenopathy, as well as tenderness over the SCM. No tenderness palpation of the right side neck.  No pain over midline C-spine. Cardiovascular:     Rate and Rhythm: Normal rate and regular rhythm.  Pulmonary:     Effort: Pulmonary effort is normal.     Breath sounds: Normal breath sounds.     Comments: Clear lung sounds in all fields Abdominal:     General: There is no distension.  Musculoskeletal:        General: Normal range of motion.     Cervical back: Normal range of motion and neck supple. Tenderness present.  Skin:    General: Skin is warm.     Findings: No rash.  Neurological:     Mental Status: He is alert and oriented to person, place, and time.     ED Results / Procedures / Treatments   Labs (all labs ordered are listed, but only abnormal results are displayed) Labs Reviewed - No data to display  EKG None  Radiology No results found.  Procedures Procedures (including critical care time)  Medications Ordered in ED Medications  ketorolac (TORADOL) 30 MG/ML injection 15 mg (15 mg Intramuscular Given 12/26/19 1702)    ED Course  I have reviewed the triage vital signs and the nursing notes.  Pertinent labs & imaging results that were available during my care of the patient were reviewed by  me and considered in my medical decision making (see chart for details).    MDM Rules/Calculators/A&P                          Patient presenting for evaluation of left-sided neck pain.  On exam, patient appears nontoxic.  Tenderness palpation over the left SCM.?  Mild cervical lymphadenopathy along the left SCM.  Exam is not consistent with peritonsillar abscess or concerning dental infection such as Ludwig's.  Consider muscle strain versus lymphadenopathy.  As patient is well-appearing, without fevers, red flags, I do not believe he needs emergent CT imaging at this time.  Discussed antibiotic treatment with nsaids and muscle relaxers.  Discussed follow-up with primary care as needed for further evaluation.  At this time, patient appears safe for discharge.  Return precautions given.  Patient states he understands and agrees to plan.  Final Clinical Impression(s) / ED Diagnoses Final diagnoses:  Neck pain on left side    Rx / DC Orders ED Discharge Orders         Ordered    naproxen (NAPROSYN) 500 MG tablet  2 times daily with meals     Discontinue  Reprint     12/26/19 1658    methocarbamol (ROBAXIN) 500 MG tablet  2 times daily PRN     Discontinue  Reprint     12/26/19 1658           Ahsha Hinsley, PA-C 12/26/19 1955    Geoffery Lyons, MD 12/27/19 0000

## 2019-12-26 NOTE — ED Triage Notes (Signed)
pt c/o sore throat x 2 days

## 2019-12-26 NOTE — Discharge Instructions (Signed)
Take naproxen 2 times a day with meals.  Do not take other anti-inflammatories at the same time open (Advil, Motrin, ibuprofen, Aleve). You may supplement with Tylenol if you need further pain control. Use Robaxin to help with muscle stiffness or soreness.  Have caution, this may make you tired or groggy.  Do not drive or operate heavy machinery while taking this medicine. Make sure you are staying well-hydrated with water. You may develop cold-like symptoms of the next several days, treat symptomatically as needed. Return to the emergency room if you develop fever, difficulty moving your neck, inability to open up your jaw all the way, muffled voice, or any new, worsening, or concerning symptoms.

## 2020-10-25 ENCOUNTER — Encounter (HOSPITAL_BASED_OUTPATIENT_CLINIC_OR_DEPARTMENT_OTHER): Payer: Self-pay | Admitting: Emergency Medicine

## 2020-10-25 ENCOUNTER — Other Ambulatory Visit: Payer: Self-pay

## 2020-10-25 ENCOUNTER — Emergency Department (HOSPITAL_BASED_OUTPATIENT_CLINIC_OR_DEPARTMENT_OTHER)
Admission: EM | Admit: 2020-10-25 | Discharge: 2020-10-25 | Disposition: A | Discharge status: Home / Self Care | Payer: 59 | Attending: Emergency Medicine | Admitting: Emergency Medicine

## 2020-10-25 DIAGNOSIS — H02846 Edema of left eye, unspecified eyelid: Secondary | ICD-10-CM | POA: Insufficient documentation

## 2020-10-25 DIAGNOSIS — Z87891 Personal history of nicotine dependence: Secondary | ICD-10-CM | POA: Diagnosis not present

## 2020-10-25 DIAGNOSIS — K118 Other diseases of salivary glands: Secondary | ICD-10-CM | POA: Insufficient documentation

## 2020-10-25 DIAGNOSIS — R609 Edema, unspecified: Secondary | ICD-10-CM

## 2020-10-25 DIAGNOSIS — R220 Localized swelling, mass and lump, head: Secondary | ICD-10-CM | POA: Diagnosis present

## 2020-10-25 NOTE — ED Provider Notes (Signed)
[facility] EMERGENCY DEPT Provider Note   CSN: [id number] Arrival date & time: 10/25/20  1112     History Chief Complaint  Patient presents with  . Facial Swelling    Dustin Reynolds is a 30 y.o. male.  HPI Patient presents with swelling of his face.  Worse on the left jaw area but some involvement of the right.  Had recently been on antihistamines and other treatments along with steroids for allergies/sinus infections.  No dental pain.  No difficulty swallowing.  States it is gotten more swollen.  No fevers or chills.  Immunizations are up-to-date.    Past Medical History:  Diagnosis Date  . Chlamydia   . Migraine     There are no problems to display for this patient.   Past Surgical History:  Procedure Laterality Date  . TONSILLECTOMY         History reviewed. No pertinent family history.  Social History   Tobacco Use  . Smoking status: Former Smoker    Types: Cigarettes  . Smokeless tobacco: Never Used  Vaping Use  . Vaping Use: Every day  Substance Use Topics  . Alcohol use: Yes    Comment: daily - mixed drinks or beer  . Drug use: No    Home Medications Prior to Admission medications   Medication Sig Start Date End Date Taking? Authorizing Provider  methocarbamol (ROBAXIN) 500 MG tablet Take 1 tablet (500 mg total) by mouth 2 (two) times daily as needed for muscle spasms. 12/26/19   Caccavale, Sophia, PA-C  naproxen (NAPROSYN) 500 MG tablet Take 1 tablet (500 mg total) by mouth 2 (two) times daily with a meal. 12/26/19   Caccavale, Sophia, PA-C    Allergies    Patient has no known allergies.  Review of Systems   Review of Systems  Constitutional: Negative for appetite change.  HENT: Positive for facial swelling. Negative for ear discharge and trouble swallowing.   Respiratory: Negative for shortness of breath.   Cardiovascular: Negative for chest pain.  Gastrointestinal: Negative for abdominal pain.  Genitourinary: Negative for flank  pain.  Musculoskeletal: Negative for back pain.  Skin: Negative for rash.  Neurological: Negative for weakness.  Psychiatric/Behavioral: Negative for confusion.    Physical Exam Updated Vital Signs BP 131/80   Pulse 73   Temp 98.2 F (36.8 C) (Oral)   Resp 18   Ht 5\' 11"  (1.803 m)   Wt 77.1 kg   SpO2 96%   BMI 23.71 kg/m   Physical Exam Vitals reviewed.  HENT:     Head:     Comments: Swelling in area of parotid gland on left.  Some swelling on the right but not as severe.  Mild swelling below this.  Mild edema above left eye.    Mouth/Throat:     Mouth: Mucous membranes are moist.  Eyes:     Pupils: Pupils are equal, round, and reactive to light.  Cardiovascular:     Rate and Rhythm: Regular rhythm.  Pulmonary:     Breath sounds: No wheezing or rhonchi.  Abdominal:     Tenderness: There is no abdominal tenderness.  Musculoskeletal:        General: No tenderness.     Cervical back: Neck supple.  Skin:    General: Skin is warm.     Capillary Refill: Capillary refill takes less than 2 seconds.  Neurological:     Mental Status: He is alert and oriented to person, place, and time.  ED Results / Procedures / Treatments   Labs (all labs ordered are listed, but only abnormal results are displayed) Labs Reviewed - No data to display  EKG None  Radiology No results found.  Procedures Procedures   Medications Ordered in ED Medications - No data to display  ED Course  I have reviewed the triage vital signs and the nursing notes.  Pertinent labs & imaging results that were available during my care of the patient were reviewed by me and considered in my medical decision making (see chart for details).    MDM Rules/Calculators/A&P                          Patient with facial swelling.  Appears to be bilateral but worse on the left.  Appears to be parotid glands.  No erythema.  No obstruction seen in the mouth.  Mild edema of eyelid also.  No swelling of  arms.  SVC syndrome felt less likely as a cause of this edema.  Discussed with patient and family member.  They are worried about cost.  We talked about potential viral testing such as Epstein-Barr virus or COVID.  However we think it is low yield would not change clinical management this point.  We will have follow-up with ENT if symptoms do not improve dramatically. Final Clinical Impression(s) / ED Diagnoses Final diagnoses:  Parotid swelling    Rx / DC Orders ED Discharge Orders    None       Benjiman Core, MD 10/25/20 1239

## 2020-10-25 NOTE — Discharge Instructions (Addendum)
Follow-up with ENT if the symptoms do not improve.

## 2020-10-25 NOTE — ED Triage Notes (Signed)
Pt arrives pov with c/o bilateral jaw and neck swelling and tenderness that started yesterday. Pt denies shob, denies difficulty swallowing

## 2020-11-11 ENCOUNTER — Telehealth: Payer: Self-pay | Admitting: Infectious Disease

## 2020-11-11 NOTE — Telephone Encounter (Signed)
Patient with bilateral enlarged parotids, referred to [place] from Dr Pollyann Kennedy. I scheduled with you CYnthia for Thursday. He got titers for mumps last week, I asked for hIV test, ebv, cmv, rpr, I believe should have a pcr for mumps could also be bacterial I guess but seesm wrong age for that

## 2020-11-12 ENCOUNTER — Telehealth: Payer: Self-pay

## 2020-11-12 NOTE — Telephone Encounter (Signed)
Called patient to get appointment changed to an MD, left voicemail for patient to call us back and get changed

## 2020-11-13 ENCOUNTER — Ambulatory Visit: Payer: 59 | Admitting: Internal Medicine

## 2020-11-13 ENCOUNTER — Ambulatory Visit: Payer: 59 | Admitting: Infectious Diseases

## 2020-11-24 ENCOUNTER — Ambulatory Visit (INDEPENDENT_AMBULATORY_CARE_PROVIDER_SITE_OTHER): Payer: 59 | Admitting: Internal Medicine

## 2020-11-24 ENCOUNTER — Encounter: Payer: Self-pay | Admitting: Internal Medicine

## 2020-11-24 ENCOUNTER — Other Ambulatory Visit: Payer: Self-pay

## 2020-11-24 VITALS — BP 142/89 | HR 102 | Wt 174.2 lb

## 2020-11-24 DIAGNOSIS — K1120 Sialoadenitis, unspecified: Secondary | ICD-10-CM | POA: Diagnosis not present

## 2020-11-24 MED ORDER — PREDNISONE 10 MG PO TABS: 40.0000 mg | ORAL_TABLET | Freq: Every day | ORAL | 0 refills | Status: AC

## 2020-11-24 NOTE — Patient Instructions (Signed)
We are arranging for imaging of your neck to happen over the next 2 wks  Start steroids  This week: Use warm compresses and also suck on citrus candy (like lemon heads) to see if any improvement

## 2020-11-24 NOTE — Progress Notes (Signed)
Patient ID: Dustin Reynolds, male   DOB: 1990-06-26, 30 y.o.   MRN: [id number]  HPI Dustin Reynolds is a 30 yo M with history of bilateral parotid swelling x 4 wk. He Has been on clindamycin for 2 weeks. Taking ibuprofen. No fever or chills. Now noticing numbness cheek nad also tenderness to inner ear. He is Uptodate of childhood vaccine. Initially was seen by ENT. Prelim labs reviewed. Affected area is measured as  6.5 cm wide x 7cm - on the right 7cm  length x 6.5cm wide on the left  Recent sunburn. Denies any recent sick contacts with similar shmptoms  Outpatient Encounter Medications as of 11/24/2020  Medication Sig   clindamycin (CLEOCIN) 300 MG capsule Take 300 mg by mouth 3 (three) times daily.   methocarbamol (ROBAXIN) 500 MG tablet Take 1 tablet (500 mg total) by mouth 2 (two) times daily as needed for muscle spasms.   naproxen (NAPROSYN) 500 MG tablet Take 1 tablet (500 mg total) by mouth 2 (two) times daily with a meal.   No facility-administered encounter medications on file as of 11/24/2020.   No Known Allergies  Family hx: GM breast cancer  Sochx: smoking (vaping) once a month; drinking regularly; may 28th - 1year anniversary -male. Sex with women only.    There are no problems to display for this patient. Pmhx: Tonsillectomy in 5th grade No other hospitalization    Health Maintenance Due  Topic Date Due   COVID-19 Vaccine (1) Never done   Hepatitis C Screening  Never done   TETANUS/TDAP  Never done     Review of Systems 12 point ros is negative except what is mentioned above Physical Exam   BP (!) 142/89   Pulse (!) 102   Wt 174 lb 3.2 oz (79 kg)   BMI 24.30 kg/m   Physical Exam  Constitutional: He is oriented to person, place, and time. He appears well-developed and well-nourished. No distress.  HENT: marked bilateral parotid swelling, measurements above Mouth/Throat: Oropharynx is clear and moist. No oropharyngeal exudate.  Cardiovascular: Normal  rate, regular rhythm and normal heart sounds. Exam reveals no gallop and no friction rub.  No murmur heard.  Pulmonary/Chest: Effort normal and breath sounds normal. No respiratory distress. He has no wheezes.  Abdominal: Soft. Bowel sounds are normal. He exhibits no distension. There is no tenderness.  Lymphadenopathy:  He has no cervical adenopathy.  Neurological: He is alert and oriented to person, place, and time.  Skin: Skin is warm and dry. No rash noted. No erythema.  Psychiatric: He has a normal mood and affect. His behavior is normal.    Lab Results  Component Value Date   LABRPR Non Reactive 12/27/2015    CBC Lab Results  Component Value Date   WBC 6.5 11/21/2015   RBC 5.08 11/21/2015   HGB 16.8 11/21/2015   HCT 49.2 11/21/2015   PLT 275 11/21/2015   MCV 96.9 11/21/2015   MCH 33.1 11/21/2015   MCHC 34.1 11/21/2015   RDW 12.8 11/21/2015    BMET Lab Results  Component Value Date   NA 135 12/27/2015   K 3.4 (L) 12/27/2015   CL 101 12/27/2015   CO2 25 12/27/2015   GLUCOSE 124 (H) 12/27/2015   BUN 7 12/27/2015   CREATININE 0.89 12/27/2015   CALCIUM 9.4 12/27/2015   GFRNONAA >60 12/27/2015   GFRAA >60 12/27/2015      Assessment and Plan Bilateral parotitis = warm compress, sucking on lemon drops  and will start on  Steroids short course to see if improvement Will do hiv testing,  Stop abtx - has been on clinda 12 d no difference Imaging neck ct to see if any abscess development Will check for IgM mumps and do mumps PCR Will also do auto-immune work if infectious work up is unrevealing  Spent 45 min with greater than 50% in direct face to face time discussing differential and treatment plan

## 2020-11-26 ENCOUNTER — Ambulatory Visit: Payer: 59 | Admitting: Internal Medicine

## 2020-11-28 LAB — ANA SCREEN,IFA,REFLEX TITER/PATTERN,REFLEX MPLX 11 AB CASCADE
14-3-3 eta Protein: <0.2 ng/mL (ref <0.2)
Anti Nuclear Antibody (ANA): NEGATIVE
Cyclic Citrullin Peptide Ab: <16 UNITS
Rhuematoid fact SerPl-aCnc: <14 IU/mL (ref <14)

## 2020-11-29 LAB — SJOGRENS SYNDROME-B EXTRACTABLE NUCLEAR ANTIBODY: SSB (La) (ENA) Antibody, IgG: <1 AI

## 2020-11-29 LAB — ECHOVIRUS ANTIBODY PANEL,SERUM
ECHOVIRUS 11 AB: <1:8 {titer}
ECHOVIRUS 30 AB: <1:8 {titer}
ECHOVIRUS 4 AB: <1:8 {titer}
ECHOVIRUS 7 AB: <1:8 {titer}
ECHOVIRUS 9 AB: <1:8 {titer}

## 2020-11-29 LAB — HIV ANTIBODY (ROUTINE TESTING W REFLEX): HIV 1&2 Ab, 4th Generation: NONREACTIVE

## 2020-11-29 LAB — HIV-1 RNA QUANT-NO REFLEX-BLD
HIV 1 RNA Quant: NOT DETECTED Copies/mL
HIV-1 RNA Quant, Log: NOT DETECTED Log cps/mL

## 2020-11-29 LAB — ANTI-DNA ANTIBODY, DOUBLE-STRANDED: ds DNA Ab: <1 IU/mL

## 2020-11-29 LAB — ADENOVIRUS ANTIBODIES: Adenovirus Antibody: <1:8 {titer}

## 2020-11-29 LAB — AMYLASE: Amylase: 54 U/L (ref 21–101)

## 2020-11-29 LAB — C-REACTIVE PROTEIN: CRP: 3 mg/L (ref <8.0)

## 2020-11-29 LAB — ANA: Anti Nuclear Antibody (ANA): NEGATIVE

## 2020-11-29 LAB — SEDIMENTATION RATE: Sed Rate: 2 mm/h (ref 0–15)

## 2020-12-08 ENCOUNTER — Inpatient Hospital Stay: Admission: RE | Admit: 2020-12-08 | Payer: 59 | Source: Ambulatory Visit

## 2021-11-19 ENCOUNTER — Other Ambulatory Visit: Payer: Self-pay | Admitting: Internal Medicine

## 2021-11-19 DIAGNOSIS — K1120 Sialoadenitis, unspecified: Secondary | ICD-10-CM

## 2022-10-11 ENCOUNTER — Encounter (HOSPITAL_BASED_OUTPATIENT_CLINIC_OR_DEPARTMENT_OTHER): Payer: Self-pay | Admitting: Emergency Medicine

## 2022-10-11 ENCOUNTER — Emergency Department (HOSPITAL_BASED_OUTPATIENT_CLINIC_OR_DEPARTMENT_OTHER)
Admission: EM | Admit: 2022-10-11 | Discharge: 2022-10-11 | Disposition: A | Discharge status: Home / Self Care | Payer: 59 | Attending: Emergency Medicine | Admitting: Emergency Medicine

## 2022-10-11 ENCOUNTER — Other Ambulatory Visit: Payer: Self-pay

## 2022-10-11 ENCOUNTER — Emergency Department (HOSPITAL_BASED_OUTPATIENT_CLINIC_OR_DEPARTMENT_OTHER): Payer: 59

## 2022-10-11 DIAGNOSIS — N5082 Scrotal pain: Secondary | ICD-10-CM | POA: Diagnosis present

## 2022-10-11 DIAGNOSIS — N503 Cyst of epididymis: Secondary | ICD-10-CM | POA: Diagnosis not present

## 2022-10-11 LAB — URINALYSIS, ROUTINE W REFLEX MICROSCOPIC
Bilirubin Urine: NEGATIVE
Glucose, UA: NEGATIVE mg/dL
Hgb urine dipstick: NEGATIVE
Ketones, ur: NEGATIVE mg/dL
Leukocytes,Ua: NEGATIVE
Nitrite: NEGATIVE
Protein, ur: NEGATIVE mg/dL
Specific Gravity, Urine: 1.014 (ref 1.005–1.030)
pH: 5.5 (ref 5.0–8.0)

## 2022-10-11 MED ORDER — OXYCODONE-ACETAMINOPHEN 5-325 MG PO TABS: 1.0000 | ORAL_TABLET | Freq: Four times a day (QID) | ORAL | 0 refills | Status: AC | PRN

## 2022-10-11 MED ORDER — KETOROLAC TROMETHAMINE 60 MG/2ML IM SOLN
30.0000 mg | Freq: Once | INTRAMUSCULAR | Status: AC
  Administered 2022-10-11: 30 mg via INTRAMUSCULAR
  Filled 2022-10-11: qty 2

## 2022-10-11 MED ORDER — OXYCODONE-ACETAMINOPHEN 5-325 MG PO TABS: 1.0000 | ORAL_TABLET | Freq: Once | ORAL | Status: DC

## 2022-10-11 NOTE — ED Triage Notes (Signed)
Testicle pain since yesterday. Denies swelling.

## 2022-10-11 NOTE — ED Provider Notes (Signed)
[place] EMERGENCY DEPARTMENT AT [facility] Provider Note   CSN: [id number] Arrival date & time: 10/11/22  0944     History  Chief Complaint  Patient presents with   Testicle Pain    Dustin Reynolds is a 32 y.o. male past medical history of migraines, chlamydia presents today for evaluation of testicular pain.  Patient reports he started to have pain in his scrotum yesterday morning.  The pain is on both of his scrotum, constant, shooting pain.  The pain is constant, denies any aggravating or alleviating factors. States cannot tell if his scrotum is swollen or not.  States symptoms radiate to his lower abdomen.  States he has not been sexually active for months.  Denies fever, urinary symptoms, no itching burning with urination, no increase in urinary urgency or frequency.  Denies any rash.   Testicle Pain      Past Medical History:  Diagnosis Date   Chlamydia    Migraine    Past Surgical History:  Procedure Laterality Date   TONSILLECTOMY       Home Medications Prior to Admission medications   Medication Sig Start Date End Date Taking? Authorizing Provider  clindamycin (CLEOCIN) 300 MG capsule Take 300 mg by mouth 3 (three) times daily. 11/11/20   [provider]  methocarbamol (ROBAXIN) 500 MG tablet Take 1 tablet (500 mg total) by mouth 2 (two) times daily as needed for muscle spasms. 12/26/19   Caccavale, Sophia, PA-C  naproxen (NAPROSYN) 500 MG tablet Take 1 tablet (500 mg total) by mouth 2 (two) times daily with a meal. 12/26/19   Caccavale, Sophia, PA-C  predniSONE (DELTASONE) 10 MG tablet Take 4 tablets (40 mg total) by mouth daily with breakfast. X 5 days, then decrease to 2 tabs per day x 5 days, then 1 tab per day x 5 days 11/24/20   Judyann Munson, MD      Allergies    Patient has no known allergies.    Review of Systems   Review of Systems  Genitourinary:  Positive for testicular pain.    Physical Exam Updated Vital Signs BP (!) 134/94  (BP Location: Right Arm)   Pulse (!) 108   Temp 97.8 F (36.6 C)   Resp 14   Ht 5\' 11"  (1.803 m)   Wt 77.1 kg   SpO2 100%   BMI 23.71 kg/m  Physical Exam Vitals and nursing note reviewed.  Constitutional:      Appearance: Normal appearance.  HENT:     Head: Normocephalic and atraumatic.     Mouth/Throat:     Mouth: Mucous membranes are moist.  Eyes:     General: No scleral icterus. Cardiovascular:     Rate and Rhythm: Normal rate and regular rhythm.     Pulses: Normal pulses.     Heart sounds: Normal heart sounds.  Pulmonary:     Effort: Pulmonary effort is normal.     Breath sounds: Normal breath sounds.  Abdominal:     General: Abdomen is flat.     Palpations: Abdomen is soft.     Tenderness: There is no abdominal tenderness.  Genitourinary:    Comments: GU exam with nurse tech present throughout examination.  There was tenderness to palpation to both sides of the scrotum.  Minimal swelling noticed.  No overlying skin erythema.  No evidence of abscess or Fournier's gangrene. Musculoskeletal:        General: No deformity.  Skin:    General: Skin is warm.  Findings: No rash.  Neurological:     General: No focal deficit present.     Mental Status: He is alert.  Psychiatric:        Mood and Affect: Mood normal.     ED Results / Procedures / Treatments   Labs (all labs ordered are listed, but only abnormal results are displayed) Labs Reviewed - No data to display  EKG None  Radiology No results found.  Procedures Procedures    Medications Ordered in ED Medications - No data to display  ED Course/ Medical Decision Making/ A&P                             Medical Decision Making Amount and/or Complexity of Data Reviewed Labs: ordered. Radiology: ordered.  Risk Prescription drug management.   This patient presents to the ED for testicular pain, this involves an extensive number of treatment options, and is a complaint that carries with a high  risk of complications and morbidity.  The differential diagnosis includes testicular torsion, epididymitis, epididymal cyst, hydrocele, varicocele, cellulitis, abscess, Fournier's gangrene, UTI, STD, infectious etiology.  This is not an exhaustive list.  Lab tests: I ordered and personally interpreted labs.  The pertinent results include: Urinalysis unremarkable.  Imaging studies: I ordered imaging studies. I personally reviewed, interpreted imaging and agree with the radiologist's interpretations. The results include: Ultrasound showed a 5 mm cyst on the right epididymis.  Problem list/ ED course/ Critical interventions/ Medical management: HPI: See above Vital signs within normal range and stable throughout visit. Laboratory/imaging studies significant for: See above. On physical examination, patient is afebrile and appears in no acute distress.  GU exam was done with nurse tech present throughout the examination.  There was tenderness to palpation to both sides of the scrotum.  Minimal swelling and redness noticed.  Low suspicion for Fournier's gangrene cellulitis, abscess.  Ultrasound showed a 5 mm cyst on the right epididymis.  Advised patient to take Tylenol/ibuprofen or Percocet as needed for pain, follow-up with urology or primary care physician for further evaluation and management.  Strict return precaution discussed. I have reviewed the patient home medicines and have made adjustments as needed.  Cardiac monitoring/EKG: The patient was maintained on a cardiac monitor.  I personally reviewed and interpreted the cardiac monitor which showed an underlying rhythm of: sinus rhythm.  Additional history obtained: External records from outside source obtained and reviewed including: Chart review including previous notes, labs, imaging.  Consultations obtained:  Disposition Continued outpatient therapy. Follow-up with PCP or urology recommended for reevaluation of symptoms. Treatment plan  discussed with patient.  Pt acknowledged understanding was agreeable to the plan. Worrisome signs and symptoms were discussed with patient, and patient acknowledged understanding to return to the ED if they noticed these signs and symptoms. Patient was stable upon discharge.   This chart was dictated using voice recognition software.  Despite best efforts to proofread,  errors can occur which can change the documentation meaning.          Final Clinical Impression(s) / ED Diagnoses Final diagnoses:  Epididymal cyst    Rx / DC Orders ED Discharge Orders     None         Jeanelle Malling, [place] 10/11/22 1207    Ernie Avena, MD 10/11/22 1740

## 2022-10-11 NOTE — Discharge Instructions (Addendum)
Your ultrasound was negative for testicular torsion or infection and your urinalysis was negative for urinary tract infection.  Your ultrasound did show evidence of a cyst on your epididymis which could be causing pain.  Recommend NSAIDs for pain control.
